# Patient Record
Sex: Male | Born: 1985 | Race: Black or African American | Hispanic: No | Marital: Single | Smoking: Current every day smoker
Health system: Southern US, Community
[De-identification: ages and names within clinical notes are randomized; demographics above are authoritative.]

## PROBLEM LIST (undated history)

## (undated) HISTORY — PX: APPENDECTOMY: SHX54

---

## 2018-03-30 ENCOUNTER — Emergency Department
Admission: EM | Admit: 2018-03-30 | Discharge: 2018-03-30 | Disposition: A | Discharge status: Home / Self Care | Payer: BLUE CROSS/BLUE SHIELD | Attending: Emergency Medicine | Admitting: Emergency Medicine

## 2018-03-30 ENCOUNTER — Encounter: Payer: Self-pay | Admitting: Emergency Medicine

## 2018-03-30 ENCOUNTER — Other Ambulatory Visit: Payer: Self-pay

## 2018-03-30 DIAGNOSIS — F17210 Nicotine dependence, cigarettes, uncomplicated: Secondary | ICD-10-CM | POA: Diagnosis not present

## 2018-03-30 DIAGNOSIS — K625 Hemorrhage of anus and rectum: Secondary | ICD-10-CM | POA: Diagnosis not present

## 2018-03-30 LAB — COMPREHENSIVE METABOLIC PANEL
ALT: 27 U/L (ref 0–44)
AST: 22 U/L (ref 15–41)
Albumin: 4.6 g/dL (ref 3.5–5.0)
Alkaline Phosphatase: 63 U/L (ref 38–126)
Anion gap: 7 (ref 5–15)
BUN: 16 mg/dL (ref 6–20)
CO2: 31 mmol/L (ref 22–32)
Calcium: 9.2 mg/dL (ref 8.9–10.3)
Chloride: 101 mmol/L (ref 98–111)
Creatinine, Ser: 0.8 mg/dL (ref 0.61–1.24)
GFR calc Af Amer: >60 mL/min (ref >60)
GFR calc non Af Amer: >60 mL/min (ref >60)
Glucose, Bld: 122 mg/dL — ABNORMAL HIGH (ref 70–99)
POTASSIUM: 3.9 mmol/L (ref 3.5–5.1)
SODIUM: 139 mmol/L (ref 135–145)
TOTAL PROTEIN: 7.4 g/dL (ref 6.5–8.1)
Total Bilirubin: 0.5 mg/dL (ref 0.3–1.2)

## 2018-03-30 LAB — CBC
HCT: 44.8 % (ref 39.0–52.0)
Hemoglobin: 15.1 g/dL (ref 13.0–17.0)
MCH: 30.6 pg (ref 26.0–34.0)
MCHC: 33.7 g/dL (ref 30.0–36.0)
MCV: 90.7 fL (ref 80.0–100.0)
Platelets: 236 10*3/uL (ref 150–400)
RBC: 4.94 MIL/uL (ref 4.22–5.81)
RDW: 11.9 % (ref 11.5–15.5)
WBC: 6.1 10*3/uL (ref 4.0–10.5)
nRBC: 0 % (ref 0.0–0.2)

## 2018-03-30 LAB — TYPE AND SCREEN
ABO/RH(D): A POS
Antibody Screen: NEGATIVE

## 2018-03-30 MED ORDER — DICYCLOMINE HCL 20 MG PO TABS: 20.0000 mg | ORAL_TABLET | Freq: Three times a day (TID) | ORAL | 0 refills | Status: AC | PRN

## 2018-03-30 MED ORDER — SUCRALFATE 1 G PO TABS: 1.0000 g | ORAL_TABLET | Freq: Four times a day (QID) | ORAL | 0 refills | Status: AC

## 2018-03-30 NOTE — ED Provider Notes (Signed)
[facility] Emergency Department Provider Note  ____________________________________________   I have reviewed the triage vital signs and the nursing notes.   HISTORY  Chief Complaint Rectal Bleeding   History limited by: Not Limited   HPI Albert Taylor is a 33 y.o. male who presents to the emergency department today because of concern for rectal bleeding.  The patient states that he had one episode of some GI bleeding roughly 1 month ago.  Today then he had an episode of dark blood in his stool and then a second episode of brighter blood.  Patient denies any associated pain.  He denies any straining with bowel movements.  He states he does have a sister with ulcerative colitis.  He denies any fevers.  Denies any recent ingestions or recent travel.   Per medical record review patient has a history of appendectomy  History reviewed. No pertinent past medical history.  There are no active problems to display for this patient.   Past Surgical History:  Procedure Laterality Date  . APPENDECTOMY      Prior to Admission medications   Not on File    Allergies Patient has no known allergies.  No family history on file.  Social History Social History   Tobacco Use  . Smoking status: Current Every Day Smoker    Packs/day: 0.50  . Smokeless tobacco: Never Used  Substance Use Topics  . Alcohol use: Never    Frequency: Never  . Drug use: Never    Review of Systems Constitutional: No fever/chills Eyes: No visual changes. ENT: No sore throat. Cardiovascular: Denies chest pain. Respiratory: Denies shortness of breath. Gastrointestinal: Positive for gi bleed.    Genitourinary: Negative for dysuria. Musculoskeletal: Negative for back pain. Skin: Negative for rash. Neurological: Negative for headaches, focal weakness or numbness.  ____________________________________________   PHYSICAL EXAM:  VITAL SIGNS: ED Triage Vitals  Enc Vitals Group     BP 03/30/18 1927 130/75     Pulse Rate 03/30/18 1927 66     Resp 03/30/18 1927 18     Temp 03/30/18 1927 98 F (36.7 C)     Temp Source 03/30/18 1927 Oral     SpO2 03/30/18 1927 98 %     Weight 03/30/18 1928 245 lb (111.1 kg)     Height 03/30/18 1928 6\' 5"  (1.956 m)     Head Circumference --      Peak Flow --      Pain Score 03/30/18 1928 0   Constitutional: Alert and oriented.  Eyes: Conjunctivae are normal.  ENT      Head: Normocephalic and atraumatic.      Nose: No congestion/rhinnorhea.      Mouth/Throat: Mucous membranes are moist.      Neck: No stridor. Cardiovascular: Normal rate, regular rhythm.  No murmurs, rubs, or gallops.  Respiratory: Normal respiratory effort without tachypnea nor retractions. Breath sounds are clear and equal bilaterally. No wheezes/rales/rhonchi. Gastrointestinal: Soft and non tender. No rebound. No guarding.  Genitourinary: Deferred Musculoskeletal: Normal range of motion in all extremities.  Neurologic:  Normal speech and language. No gross focal neurologic deficits are appreciated.  Skin:  Skin is warm, dry and intact. No rash noted. Psychiatric: Mood and affect are normal. Speech and behavior are normal. Patient exhibits appropriate insight and judgment.  ____________________________________________    LABS (pertinent positives/negatives)  CBC wbc 6.1, hgb 15.1, plt 236 CMP wnl except glu 122  ____________________________________________   EKG  None  ____________________________________________  RADIOLOGY  None  ____________________________________________   PROCEDURES  Procedures  ____________________________________________   INITIAL IMPRESSION / ASSESSMENT AND PLAN / ED COURSE  Pertinent labs & imaging results that were available during my care of the patient were reviewed by me and considered in my medical decision making (see chart for details).   Patient presented to the emergency department today because  of concerns for some GI bleeding.  Blood work here without any leukocytosis or anemia.  Patient's abdominal exam is benign.  At this point do wonder if IBS for ulcerative colitis cause.  Discussed this with the patient.  At this point given normal blood work do not think he needs any further emergent work-up.  Will give patient both GI and primary care doctor follow-up information.  ____________________________________________   FINAL CLINICAL IMPRESSION(S) / ED DIAGNOSES  Final diagnoses:  Rectal bleeding     Note: This dictation was prepared with Dragon dictation. Any transcriptional errors that result from this process are unintentional     Phineas Semen, MD 03/31/18 [phone]

## 2018-03-30 NOTE — ED Triage Notes (Signed)
Pt arrives ambulatory to triage with c/o rectal bleeding. Pt reports that earlier he had some dark blood in his stool and about 30 minutes ago he had some bright blood in his stool. Pt is in NAD.

## 2018-03-30 NOTE — Discharge Instructions (Addendum)
Please seek medical attention for any high fevers, chest pain, shortness of breath, change in behavior, persistent vomiting, bloody stool or any other new or concerning symptoms.  

## 2018-05-21 ENCOUNTER — Other Ambulatory Visit: Payer: Self-pay

## 2018-05-21 ENCOUNTER — Encounter: Payer: Self-pay | Admitting: Emergency Medicine

## 2018-05-21 ENCOUNTER — Ambulatory Visit
Admission: EM | Admit: 2018-05-21 | Discharge: 2018-05-21 | Disposition: A | Discharge status: Home / Self Care | Payer: BLUE CROSS/BLUE SHIELD | Attending: Family Medicine | Admitting: Family Medicine

## 2018-05-21 DIAGNOSIS — R0789 Other chest pain: Secondary | ICD-10-CM

## 2018-05-21 DIAGNOSIS — M940 Chondrocostal junction syndrome [Tietze]: Secondary | ICD-10-CM | POA: Insufficient documentation

## 2018-05-21 NOTE — Discharge Instructions (Signed)
Heat/ice alternating am and pm Over the counter Aleve

## 2018-05-21 NOTE — ED Triage Notes (Signed)
Patient c/o chest pain for a week.  Patient denies SOB.  Patient denies cold symptoms.  Patient denies fevers.  Patient also reports HAs off and on for couple of weeks.

## 2018-06-14 NOTE — ED Provider Notes (Signed)
[facility]    CSN: [id number] Arrival date & time: 05/21/18  1545     History   Chief Complaint Chief Complaint  Patient presents with  . Chest Pain    HPI Albert Taylor is a 33 y.o. male.   33 yo male with a c/o chest pains for the past week, worse with movement. Denies any shortness of breath, palpitations, diaphoresis, neck/jaw pain, left arm pain, fevers, chills, cough.   The history is provided by the patient.  Chest Pain    History reviewed. No pertinent past medical history.  There are no active problems to display for this patient.   Past Surgical History:  Procedure Laterality Date  . APPENDECTOMY         Home Medications    Prior to Admission medications   Medication Sig Start Date End Date Taking? Authorizing Provider  dicyclomine (BENTYL) 20 MG tablet Take 1 tablet (20 mg total) by mouth 3 (three) times daily as needed (abdominal pain). 03/30/18   Phineas SemenGoodman, Graydon, MD  sucralfate (CARAFATE) 1 g tablet Take 1 tablet (1 g total) by mouth 4 (four) times daily. 03/30/18   Phineas SemenGoodman, Graydon, MD    Family History Family History  Problem Relation Age of Onset  . Healthy Mother   . Heart attack Father     Social History Social History   Tobacco Use  . Smoking status: Current Every Day Smoker    Packs/day: 0.50    Types: Cigarettes  . Smokeless tobacco: Never Used  Substance Use Topics  . Alcohol use: Never    Frequency: Never  . Drug use: Never     Allergies   Patient has no known allergies.   Review of Systems Review of Systems  Cardiovascular: Positive for chest pain.     Physical Exam Triage Vital Signs ED Triage Vitals  Enc Vitals Group     BP 05/21/18 1557 132/75     Pulse Rate 05/21/18 1557 76     Resp 05/21/18 1557 16     Temp 05/21/18 1557 98.4 F (36.9 C)     Temp Source 05/21/18 1557 Oral     SpO2 05/21/18 1557 99 %     Weight 05/21/18 1554 250 lb (113.4 kg)     Height 05/21/18 1554 6\' 5"  (1.956 m)   Head Circumference --      Peak Flow --      Pain Score 05/21/18 1554 4     Pain Loc --      Pain Edu? --      Excl. in GC? --    No data found.  Updated Vital Signs BP 132/75 (BP Location: Right Arm)   Pulse 76   Temp 98.4 F (36.9 C) (Oral)   Resp 16   Ht 6\' 5"  (1.956 m)   Wt 113.4 kg   SpO2 99%   BMI 29.65 kg/m   Visual Acuity Right Eye Distance:   Left Eye Distance:   Bilateral Distance:    Right Eye Near:   Left Eye Near:    Bilateral Near:     Physical Exam Vitals signs and nursing note reviewed.  Constitutional:      General: He is not in acute distress.    Appearance: He is not toxic-appearing or diaphoretic.  Cardiovascular:     Rate and Rhythm: Normal rate and regular rhythm.  Pulmonary:     Effort: Pulmonary effort is normal. No respiratory distress.     Breath sounds:  Normal breath sounds. No stridor. No wheezing, rhonchi or rales.  Chest:     Chest wall: Tenderness present.  Neurological:     Mental Status: He is alert.      UC Treatments / Results  Labs (all labs ordered are listed, but only abnormal results are displayed) Labs Reviewed - No data to display  EKG None  Radiology No results found.  Procedures ED EKG Date/Time: 06/14/2018 5:13 PM Performed by: Payton Mccallum, MD Authorized by: Payton Mccallum, MD   ECG reviewed by ED Physician in the absence of a cardiologist: yes   Previous ECG:    Previous ECG:  Unavailable Interpretation:    Interpretation: normal   Rate:    ECG rate:  74   ECG rate assessment: normal   Rhythm:    Rhythm: sinus rhythm   Ectopy:    Ectopy: none   QRS:    QRS axis:  Normal Conduction:    Conduction: normal   ST segments:    ST segments:  Normal T waves:    T waves: normal     (including critical care time)  Medications Ordered in UC Medications - No data to display  Initial Impression / Assessment and Plan / UC Course  I have reviewed the triage vital signs and the nursing notes.   Pertinent labs & imaging results that were available during my care of the patient were reviewed by me and considered in my medical decision making (see chart for details).      Final Clinical Impressions(s) / UC Diagnoses   Final diagnoses:  Costochondritis     Discharge Instructions     Heat/ice alternating am and pm Over the counter Aleve    ED Prescriptions    None      1. ekg results and diagnosis reviewed with patient 2. Recommend supportive treatment as above  3. Follow-up prn if symptoms worsen or don't improve  Controlled Substance Prescriptions [place] Controlled Substance Registry consulted? Not Applicable   Payton Mccallum, MD 06/14/18 [phone]

## 2018-07-14 ENCOUNTER — Encounter: Payer: Self-pay | Admitting: Emergency Medicine

## 2018-07-14 ENCOUNTER — Emergency Department: Payer: BC Managed Care – PPO

## 2018-07-14 ENCOUNTER — Emergency Department
Admission: EM | Admit: 2018-07-14 | Discharge: 2018-07-14 | Disposition: A | Discharge status: Home / Self Care | Payer: BC Managed Care – PPO | Attending: Emergency Medicine | Admitting: Emergency Medicine

## 2018-07-14 ENCOUNTER — Other Ambulatory Visit: Payer: Self-pay

## 2018-07-14 DIAGNOSIS — S9031XA Contusion of right foot, initial encounter: Secondary | ICD-10-CM | POA: Insufficient documentation

## 2018-07-14 DIAGNOSIS — Y9289 Other specified places as the place of occurrence of the external cause: Secondary | ICD-10-CM | POA: Diagnosis not present

## 2018-07-14 DIAGNOSIS — Y9389 Activity, other specified: Secondary | ICD-10-CM | POA: Diagnosis not present

## 2018-07-14 DIAGNOSIS — T1490XA Injury, unspecified, initial encounter: Secondary | ICD-10-CM

## 2018-07-14 DIAGNOSIS — F17210 Nicotine dependence, cigarettes, uncomplicated: Secondary | ICD-10-CM | POA: Diagnosis not present

## 2018-07-14 DIAGNOSIS — Y998 Other external cause status: Secondary | ICD-10-CM | POA: Diagnosis not present

## 2018-07-14 DIAGNOSIS — W2209XA Striking against other stationary object, initial encounter: Secondary | ICD-10-CM | POA: Insufficient documentation

## 2018-07-14 DIAGNOSIS — S99921A Unspecified injury of right foot, initial encounter: Secondary | ICD-10-CM | POA: Diagnosis present

## 2018-07-14 NOTE — ED Provider Notes (Signed)
[facility] Emergency Department Provider Note  ____________________________________________  Time seen: Approximately 9:36 PM  I have reviewed the triage vital signs and the nursing notes.   HISTORY  Chief Complaint Toe Injury    HPI Albert Taylor is a 33 y.o. male who presents the emergency department complaining of pain to the second digit of the right foot.  Patient presented to the emergency department complaining of pain and injury to the toe.  Patient was playing with his daughter in the backyard when he stubbed his toe.  Patient reports that the right toe is now longer than the similar toe on the left foot.  Patient is having pain to the DIP joint to the distal aspect of the toe.  No other injury or complaint.  No medications prior to arrival.        History reviewed. No pertinent past medical history.  There are no active problems to display for this patient.   Past Surgical History:  Procedure Laterality Date  . APPENDECTOMY      Prior to Admission medications   Medication Sig Start Date End Date Taking? Authorizing Provider  dicyclomine (BENTYL) 20 MG tablet Take 1 tablet (20 mg total) by mouth 3 (three) times daily as needed (abdominal pain). 03/30/18   Phineas SemenGoodman, Graydon, MD  sucralfate (CARAFATE) 1 g tablet Take 1 tablet (1 g total) by mouth 4 (four) times daily. 03/30/18   Phineas SemenGoodman, Graydon, MD    Allergies Patient has no known allergies.  Family History  Problem Relation Age of Onset  . Healthy Mother   . Heart attack Father     Social History Social History   Tobacco Use  . Smoking status: Current Every Day Smoker    Packs/day: 0.50    Types: Cigarettes  . Smokeless tobacco: Never Used  Substance Use Topics  . Alcohol use: Never    Frequency: Never  . Drug use: Never     Review of Systems  Constitutional: No fever/chills Eyes: No visual changes. No discharge ENT: No upper respiratory complaints. Cardiovascular: no  chest pain. Respiratory: no cough. No SOB. Gastrointestinal: No abdominal pain.  No nausea, no vomiting.  No diarrhea.  No constipation. Musculoskeletal: Positive for pain to the second digit of the right foot Skin: Negative for rash, abrasions, lacerations, ecchymosis. Neurological: Negative for headaches, focal weakness or numbness. 10-point ROS otherwise negative.  ____________________________________________   PHYSICAL EXAM:  VITAL SIGNS: ED Triage Vitals  Enc Vitals Group     BP 07/14/18 2103 (!) 146/95     Pulse Rate 07/14/18 2103 80     Resp 07/14/18 2103 18     Temp 07/14/18 2103 98.2 F (36.8 C)     Temp Source 07/14/18 2103 Oral     SpO2 07/14/18 2103 99 %     Weight 07/14/18 2102 250 lb (113.4 kg)     Height 07/14/18 2102 6\' 5"  (1.956 m)     Head Circumference --      Peak Flow --      Pain Score 07/14/18 2102 3     Pain Loc --      Pain Edu? --      Excl. in GC? --      Constitutional: Alert and oriented. Well appearing and in no acute distress. Eyes: Conjunctivae are normal. PERRL. EOMI. Head: Atraumatic. Neck: No stridor.    Cardiovascular: Normal rate, regular rhythm. Normal S1 and S2.  Good peripheral circulation. Respiratory: Normal respiratory effort without tachypnea  or retractions. Lungs CTAB. Good air entry to the bases with no decreased or absent breath sounds. Musculoskeletal: Full range of motion to all extremities. No gross deformities appreciated.  Examination of the right foot reveals mild edema of the distal aspect of the second digit.  No deformity.  No ecchymosis.  No open wounds.  Patient is very tender to palpation from the DIP joint to the distal aspect of the toe.  Sensation and cap refill intact. Neurologic:  Normal speech and language. No gross focal neurologic deficits are appreciated.  Skin:  Skin is warm, dry and intact. No rash noted. Psychiatric: Mood and affect are normal. Speech and behavior are normal. Patient exhibits appropriate  insight and judgement.   ____________________________________________   LABS (all labs ordered are listed, but only abnormal results are displayed)  Labs Reviewed - No data to display ____________________________________________  EKG   ____________________________________________  RADIOLOGY I personally viewed and evaluated these images as part of my medical decision making, as well as reviewing the written report by the radiologist.  Dg Toe 2nd Right  Result Date: 07/14/2018 CLINICAL DATA:  Second toe injury EXAM: RIGHT SECOND TOE COMPARISON:  None. FINDINGS: There is no evidence of fracture or dislocation. There is no evidence of arthropathy or other focal bone abnormality. Soft tissues are unremarkable. IMPRESSION: Negative. Electronically Signed   By: Deatra Robinson M.D.   On: 07/14/2018 22:09    ____________________________________________    PROCEDURES  Procedure(s) performed:    Procedures    Medications - No data to display   ____________________________________________   INITIAL IMPRESSION / ASSESSMENT AND PLAN / ED COURSE  Pertinent labs & imaging results that were available during my care of the patient were reviewed by me and considered in my medical decision making (see chart for details).  Review of the [place] CSRS was performed in accordance of the [facility] prior to dispensing any controlled drugs.           Patient's diagnosis is consistent with foot contusion.  Patient presented to the emergency department complaining of second toe pain to the right foot.  Overall exam is reassuring.  X-ray reveals no acute osseous abnormality.  Patient is given postop shoe for symptom improvement.  Prescription for meloxicam for additional symptom control.  Follow-up primary care as needed. Patient is given ED precautions to return to the ED for any worsening or new symptoms.     ____________________________________________  FINAL CLINICAL IMPRESSION(S) / ED  DIAGNOSES  Final diagnoses:  Contusion of right foot, initial encounter      NEW MEDICATIONS STARTED DURING THIS VISIT:  ED Discharge Orders    None          This chart was dictated using voice recognition software/Dragon. Despite best efforts to proofread, errors can occur which can change the meaning. Any change was purely unintentional.    Racheal Patches, PA-C 07/14/18 2237    Dionne Bucy, MD 07/14/18 [phone]

## 2018-07-14 NOTE — ED Triage Notes (Signed)
Patient ambulatory to triage with steady gait, without difficulty or distress noted, mask in place; pt reports rt 3rd toe injury today while playing outside

## 2018-08-28 ENCOUNTER — Other Ambulatory Visit: Payer: Self-pay

## 2018-08-28 DIAGNOSIS — Z20822 Contact with and (suspected) exposure to covid-19: Secondary | ICD-10-CM

## 2018-09-03 LAB — NOVEL CORONAVIRUS, NAA: SARS-CoV-2, NAA: NOT DETECTED

## 2019-05-17 ENCOUNTER — Emergency Department (HOSPITAL_COMMUNITY): Payer: BC Managed Care – PPO

## 2019-05-17 ENCOUNTER — Emergency Department (HOSPITAL_COMMUNITY)
Admission: EM | Admit: 2019-05-17 | Discharge: 2019-05-17 | Disposition: A | Discharge status: Home / Self Care | Payer: BC Managed Care – PPO | Attending: Emergency Medicine | Admitting: Emergency Medicine

## 2019-05-17 ENCOUNTER — Other Ambulatory Visit: Payer: Self-pay

## 2019-05-17 DIAGNOSIS — S93401A Sprain of unspecified ligament of right ankle, initial encounter: Secondary | ICD-10-CM | POA: Insufficient documentation

## 2019-05-17 DIAGNOSIS — T07XXXA Unspecified multiple injuries, initial encounter: Secondary | ICD-10-CM

## 2019-05-17 DIAGNOSIS — S99911A Unspecified injury of right ankle, initial encounter: Secondary | ICD-10-CM | POA: Diagnosis present

## 2019-05-17 DIAGNOSIS — S40811A Abrasion of right upper arm, initial encounter: Secondary | ICD-10-CM | POA: Insufficient documentation

## 2019-05-17 DIAGNOSIS — Y92410 Unspecified street and highway as the place of occurrence of the external cause: Secondary | ICD-10-CM | POA: Insufficient documentation

## 2019-05-17 DIAGNOSIS — T1490XA Injury, unspecified, initial encounter: Secondary | ICD-10-CM

## 2019-05-17 DIAGNOSIS — Y999 Unspecified external cause status: Secondary | ICD-10-CM | POA: Diagnosis not present

## 2019-05-17 DIAGNOSIS — S30811A Abrasion of abdominal wall, initial encounter: Secondary | ICD-10-CM | POA: Diagnosis not present

## 2019-05-17 DIAGNOSIS — Y939 Activity, unspecified: Secondary | ICD-10-CM | POA: Insufficient documentation

## 2019-05-17 LAB — I-STAT CHEM 8, ED
BUN: 15 mg/dL (ref 6–20)
Calcium, Ion: 1.09 mmol/L — ABNORMAL LOW (ref 1.15–1.40)
Chloride: 103 mmol/L (ref 98–111)
Creatinine, Ser: 0.9 mg/dL (ref 0.61–1.24)
Glucose, Bld: 100 mg/dL — ABNORMAL HIGH (ref 70–99)
HCT: 44 % (ref 39.0–52.0)
Hemoglobin: 15 g/dL (ref 13.0–17.0)
Potassium: 4.1 mmol/L (ref 3.5–5.1)
Sodium: 137 mmol/L (ref 135–145)
TCO2: 27 mmol/L (ref 22–32)

## 2019-05-17 LAB — COMPREHENSIVE METABOLIC PANEL WITH GFR
ALT: 63 U/L — ABNORMAL HIGH (ref 0–44)
AST: 34 U/L (ref 15–41)
Albumin: 4 g/dL (ref 3.5–5.0)
Alkaline Phosphatase: 78 U/L (ref 38–126)
Anion gap: 12 (ref 5–15)
BUN: 14 mg/dL (ref 6–20)
CO2: 21 mmol/L — ABNORMAL LOW (ref 22–32)
Calcium: 9 mg/dL (ref 8.9–10.3)
Chloride: 103 mmol/L (ref 98–111)
Creatinine, Ser: 0.93 mg/dL (ref 0.61–1.24)
GFR calc Af Amer: >60 mL/min
GFR calc non Af Amer: >60 mL/min
Glucose, Bld: 106 mg/dL — ABNORMAL HIGH (ref 70–99)
Potassium: 4.1 mmol/L (ref 3.5–5.1)
Sodium: 136 mmol/L (ref 135–145)
Total Bilirubin: 0.5 mg/dL (ref 0.3–1.2)
Total Protein: 6.9 g/dL (ref 6.5–8.1)

## 2019-05-17 LAB — CBC
HCT: 45.9 % (ref 39.0–52.0)
Hemoglobin: 15.4 g/dL (ref 13.0–17.0)
MCH: 30.3 pg (ref 26.0–34.0)
MCHC: 33.6 g/dL (ref 30.0–36.0)
MCV: 90.4 fL (ref 80.0–100.0)
Platelets: 258 10*3/uL (ref 150–400)
RBC: 5.08 MIL/uL (ref 4.22–5.81)
RDW: 11.9 % (ref 11.5–15.5)
WBC: 6.8 10*3/uL (ref 4.0–10.5)
nRBC: 0 % (ref 0.0–0.2)

## 2019-05-17 LAB — LACTIC ACID, PLASMA: Lactic Acid, Venous: 1.6 mmol/L (ref 0.5–1.9)

## 2019-05-17 LAB — PROTIME-INR
INR: 0.9 (ref 0.8–1.2)
Prothrombin Time: 12.2 s (ref 11.4–15.2)

## 2019-05-17 LAB — ETHANOL: Alcohol, Ethyl (B): <10 mg/dL

## 2019-05-17 MED ORDER — BACITRACIN ZINC 500 UNIT/GM EX OINT: TOPICAL_OINTMENT | CUTANEOUS | Status: DC | PRN

## 2019-05-17 MED ORDER — OXYCODONE-ACETAMINOPHEN 5-325 MG PO TABS: 1.0000 | ORAL_TABLET | ORAL | 0 refills | Status: DC | PRN

## 2019-05-17 MED ORDER — MORPHINE SULFATE (PF) 4 MG/ML IV SOLN
4.0000 mg | Freq: Once | INTRAVENOUS | Status: AC
  Administered 2019-05-17: 4 mg via INTRAVENOUS
  Filled 2019-05-17: qty 1

## 2019-05-17 MED ORDER — OXYCODONE-ACETAMINOPHEN 5-325 MG PO TABS
2.0000 | ORAL_TABLET | Freq: Once | ORAL | Status: AC
  Administered 2019-05-17: 2 via ORAL
  Filled 2019-05-17: qty 2

## 2019-05-17 MED ORDER — BACITRACIN ZINC 500 UNIT/GM EX OINT
TOPICAL_OINTMENT | CUTANEOUS | Status: DC | PRN
  Filled 2019-05-17: qty 28.4

## 2019-05-17 NOTE — Discharge Instructions (Addendum)
Please use crutches as needed for right ankle pain Please keep elevated and use cold therapy. Call Dr. Diamantina [place] office for follow-up next week.
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
## 2019-05-17 NOTE — ED Notes (Signed)
X-ray at bedside

## 2019-05-17 NOTE — ED Notes (Signed)
No CTs needed at this time per Rosalia Hammers, MD.
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
## 2019-05-18 LAB — SAMPLE TO BLOOD BANK

## 2019-05-19 ENCOUNTER — Encounter (HOSPITAL_COMMUNITY): Payer: Self-pay

## 2019-05-19 ENCOUNTER — Other Ambulatory Visit: Payer: Self-pay

## 2019-05-19 ENCOUNTER — Emergency Department (HOSPITAL_COMMUNITY)
Admission: EM | Admit: 2019-05-19 | Discharge: 2019-05-20 | Disposition: A | Discharge status: Home / Self Care | Payer: BC Managed Care – PPO | Attending: Emergency Medicine | Admitting: Emergency Medicine

## 2019-05-19 DIAGNOSIS — M549 Dorsalgia, unspecified: Secondary | ICD-10-CM | POA: Diagnosis not present

## 2019-05-19 DIAGNOSIS — M25511 Pain in right shoulder: Secondary | ICD-10-CM | POA: Insufficient documentation
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
## 2019-06-03 ENCOUNTER — Ambulatory Visit: Payer: BC Managed Care – PPO | Admitting: Orthopedic Surgery

## 2020-11-15 ENCOUNTER — Emergency Department (HOSPITAL_COMMUNITY)
Admission: EM | Admit: 2020-11-15 | Discharge: 2020-11-16 | Disposition: A | Discharge status: Home / Self Care | Payer: BC Managed Care – PPO | Attending: Emergency Medicine | Admitting: Emergency Medicine

## 2020-11-15 ENCOUNTER — Encounter (HOSPITAL_COMMUNITY): Payer: Self-pay | Admitting: Emergency Medicine

## 2020-11-15 DIAGNOSIS — R42 Dizziness and giddiness: Secondary | ICD-10-CM | POA: Diagnosis not present

## 2020-11-15 DIAGNOSIS — F17210 Nicotine dependence, cigarettes, uncomplicated: Secondary | ICD-10-CM | POA: Diagnosis not present

## 2020-11-15 DIAGNOSIS — T782XXA Anaphylactic shock, unspecified, initial encounter: Secondary | ICD-10-CM | POA: Diagnosis not present

## 2020-11-15 DIAGNOSIS — L509 Urticaria, unspecified: Secondary | ICD-10-CM | POA: Diagnosis not present

## 2020-11-15 MED ORDER — SODIUM CHLORIDE 0.9 % IV BOLUS
1000.0000 mL | Freq: Once | INTRAVENOUS | Status: AC
  Administered 2020-11-15: 1000 mL via INTRAVENOUS

## 2020-11-15 MED ORDER — EPINEPHRINE 0.3 MG/0.3ML IJ SOAJ: 0.3000 mg | INTRAMUSCULAR | 0 refills | Status: AC | PRN

## 2020-11-15 MED ORDER — FAMOTIDINE IN NACL 20-0.9 MG/50ML-% IV SOLN
20.0000 mg | Freq: Once | INTRAVENOUS | Status: AC
  Administered 2020-11-15: 20 mg via INTRAVENOUS
  Filled 2020-11-15: qty 50

## 2020-11-15 MED ORDER — PREDNISONE 50 MG PO TABS: 50.0000 mg | ORAL_TABLET | Freq: Every day | ORAL | 0 refills | Status: AC

## 2020-11-15 NOTE — ED Triage Notes (Signed)
Pt in via [facility] w/anaphylaxis, unknown allergy - works night shift, and woke up this afternoon with generalized hives. Took Benadryl at 1pm, 6pm. EMS gave 125mg  solumedrol and IM Epi en route. Benadryl at 1pm. NS en route. Felt syncopal about an hr ago, and called EMS. BP initially 89/60, 150/90 after fluids. Sats 99% en route RA. Significant facial swelling and generalized hives present

## 2020-11-15 NOTE — Discharge Instructions (Signed)
Your history, exam, work-up today are consistent with an anaphylactic reaction to some substance.  Your symptoms were able to improve after the steroids, epinephrine, and antihistamines provided both by EMS and the emergency department.  Your symptoms remained improved after over 4 hours of observation.  Please fill the prescriptions for EpiPen and steroids and use over-the-counter antihistamines to help with itching.  Please take steroids for the next 5 days starting tomorrow and please follow-up with both a PCP and an allergist for allergy testing.  If any symptoms change or worsen acutely, please return to the nearest emergency department.  Please rest and stay hydrated.

## 2020-11-15 NOTE — ED Provider Notes (Signed)
[facility] EMERGENCY DEPARTMENT Provider Note   CSN: [id number] Arrival date & time: 11/15/20  1935     History No chief complaint on file.   Natasha Stangelo is a 35 y.o. male.  The history is provided by the patient, medical records and the EMS personnel. No language interpreter was used.  Allergic Reaction Presenting symptoms: itching, rash and swelling   Presenting symptoms: no wheezing   Severity:  Severe Duration:  5 hours Prior allergic episodes:  No prior episodes Relieved by:  Epinephrine, antihistamines and steroids Worsened by:  Nothing Ineffective treatments:  None tried     No past medical history on file.  There are no problems to display for this patient.   Past Surgical History:  Procedure Laterality Date   APPENDECTOMY         Family History  Problem Relation Age of Onset   Healthy Mother    Heart attack Father     Social History   Tobacco Use   Smoking status: Every Day    Packs/day: 0.50    Types: Cigarettes   Smokeless tobacco: Never  Vaping Use   Vaping Use: Never used  Substance Use Topics   Alcohol use: Never   Drug use: Never    Home Medications Prior to Admission medications   Medication Sig Start Date End Date Taking? Authorizing Provider  dicyclomine (BENTYL) 20 MG tablet Take 1 tablet (20 mg total) by mouth 3 (three) times daily as needed (abdominal pain). 03/30/18   Phineas Semen, MD  oxyCODONE-acetaminophen (PERCOCET/ROXICET) 5-325 MG tablet Take 1 tablet by mouth every 4 (four) hours as needed for severe pain. 05/17/19   Margarita Grizzle, MD  sucralfate (CARAFATE) 1 g tablet Take 1 tablet (1 g total) by mouth 4 (four) times daily. 03/30/18   Phineas Semen, MD    Allergies    Patient has no known allergies.  Review of Systems   Review of Systems  Constitutional:  Positive for fatigue.  HENT:  Positive for facial swelling. Negative for congestion.   Eyes:  Negative for visual disturbance.   Respiratory:  Negative for choking, chest tightness, shortness of breath, wheezing and stridor.   Cardiovascular:  Negative for chest pain and leg swelling.  Gastrointestinal:  Negative for abdominal pain.  Genitourinary:  Negative for flank pain.  Musculoskeletal:  Negative for back pain.  Skin:  Positive for itching and rash. Negative for wound.  Neurological:  Negative for seizures, light-headedness and headaches.  Psychiatric/Behavioral:  Negative for agitation.   All other systems reviewed and are negative.  Physical Exam Updated Vital Signs There were no vitals taken for this visit.  Physical Exam Vitals and nursing note reviewed.  Constitutional:      General: He is not in acute distress.    Appearance: He is well-developed. He is ill-appearing. He is not toxic-appearing or diaphoretic.  HENT:     Head: Atraumatic.     Nose: No congestion or rhinorrhea.     Mouth/Throat:     Mouth: Mucous membranes are moist.     Pharynx: No oropharyngeal exudate or posterior oropharyngeal erythema.  Eyes:     Extraocular Movements: Extraocular movements intact.     Conjunctiva/sclera: Conjunctivae normal.     Pupils: Pupils are equal, round, and reactive to light.  Cardiovascular:     Rate and Rhythm: Normal rate and regular rhythm.     Heart sounds: No murmur heard. Pulmonary:     Effort: Pulmonary effort is normal.  No respiratory distress.     Breath sounds: Normal breath sounds.  Abdominal:     General: Abdomen is flat.     Palpations: Abdomen is soft.     Tenderness: There is no abdominal tenderness.  Musculoskeletal:        General: No tenderness.     Cervical back: Neck supple. No tenderness.  Skin:    General: Skin is warm and dry.     Capillary Refill: Capillary refill takes less than 2 seconds.     Findings: Erythema and rash present. Rash is urticarial.     Comments:   Diffuse urticarial rash and facial swelling.  No wheezing.  No stridor.  No sensation of throat  closing or evidence of swelling in his lips or tongue.  Neurological:     General: No focal deficit present.     Mental Status: He is alert.  Psychiatric:        Mood and Affect: Mood normal.    ED Results / Procedures / Treatments   Labs (all labs ordered are listed, but only abnormal results are displayed) Labs Reviewed - No data to display  EKG None  Radiology No results found.  Procedures Procedures   CRITICAL CARE Performed by: Canary Brim Alizzon Dioguardi Total critical care time: 35 minutes Critical care time was exclusive of separately billable procedures and treating other patients. Critical care was necessary to treat or prevent imminent or life-threatening deterioration. Critical care was time spent personally by me on the following activities: development of treatment plan with patient and/or surrogate as well as nursing, discussions with consultants, evaluation of patient's response to treatment, examination of patient, obtaining history from patient or surrogate, ordering and performing treatments and interventions, ordering and review of laboratory studies, ordering and review of radiographic studies, pulse oximetry and re-evaluation of patient's condition.  Medications Ordered in ED Medications  sodium chloride 0.9 % bolus 1,000 mL (0 mLs Intravenous Stopped 11/15/20 2106)  famotidine (PEPCID) IVPB 20 mg premix (0 mg Intravenous Stopped 11/15/20 2023)    ED Course  I have reviewed the triage vital signs and the nursing notes.  Pertinent labs & imaging results that were available during my care of the patient were reviewed by me and considered in my medical decision making (see chart for details).    MDM Rules/Calculators/A&P                           Matheus Kangas is a 35 y.o. male with no significant past medical history who presents with anaphylactic reaction.  According to EMS and patient, patient woke up around 1 PM this afternoon with some itching.  He then went  back to sleep but then woke up about an hour ago with diffuse urticarial rash, facial swelling, and continued itching.  He then got lightheaded like he was going to pass out but did not actually syncopized.  He denies any difficulty breathing or swallowing and denies any sensation of swelling in his throat or in his mouth.  According to EMS, patient started to worsen and was getting lightheaded with the facial swelling they decided to provide antihistamines, steroids, and epi in the field.  Patient received IM epinephrine, Solu-Medrol, and Benadryl.  He reports he has never had an allergic reaction like this in the past and denies any known exposures.  He denies any new foods or medications, denies any other exposures to supplements or drinks and denies any other cause  of this.  On arrival, patient thinks that things are starting to plateau and stabilize and do not seem to be worsening.  On my exam, patient does have diffuse urticaria all over his body including the face.  He does have swelling and his eyes are nearly swollen shut with facial swelling.  He does not have evidence of lip swelling or tongue swelling and denies any difficulty breathing.  He did not have stridor and did not have wheezing on my exam.  Initial vital signs while he was receiving fluids was not hypotensive or tachycardic.  Afebrile.  Patient now resting but jittery from the appendectomy.  Clinically I suspect that he has an unknown anaphylactic reaction and will need to be observed for several hours to make sure things do not worsen.  As he received the Solu-Medrol, epi, and Benadryl, we will only give some fluids and IV Pepcid to add to his antihistamine regimen.  We will continue to watch him.  If symptoms worsen, anticipate reducing of epi and likely admission however if the symptoms continue to stabilize and improve after several hours, anticipate discharge home.  EMS did report that his blood pressure was in the 80s when he  first saw him and he was lightheaded.  Anticipate reassessment after medications and monitoring.  8:56 PM Patient just reassessed and his rash starting to improve.  He is not hypotensive and is resting comfortably.  Facial swelling seems to be stabilizing as well.  We will continue to monitor.  Patient's epi was given between 6 or 7 PM.  We will continue to monitor.    Final Clinical Impression(s) / ED Diagnoses Final diagnoses:  Anaphylaxis, initial encounter     Clinical Impression: 1. Anaphylaxis, initial encounter     Disposition: Discharge  Condition: Good  I have discussed the results, Dx and Tx plan with the pt(& family if present). He/she/they expressed understanding and agree(s) with the plan. Discharge instructions discussed at great length. Strict return precautions discussed and pt &/or family have verbalized understanding of the instructions. No further questions at time of discharge.    New Prescriptions   EPINEPHRINE 0.3 MG/0.3 ML IJ SOAJ INJECTION    Inject 0.3 mg into the muscle as needed for anaphylaxis.   PREDNISONE (DELTASONE) 50 MG TABLET    Take 1 tablet (50 mg total) by mouth daily with breakfast for 5 days.    Follow Up: [facility] [address] [place] [address] [phone] Schedule an appointment as soon as possible for a visit    [facility] EMERGENCY DEPARTMENT [address] [phone] mc [place] [address] [phone]    an allergist        Mande Auvil, Canary Brim, MD 11/15/20 2351

## 2020-11-19 ENCOUNTER — Emergency Department (HOSPITAL_BASED_OUTPATIENT_CLINIC_OR_DEPARTMENT_OTHER)
Admission: EM | Admit: 2020-11-19 | Discharge: 2020-11-19 | Disposition: A | Discharge status: Home / Self Care | Payer: BC Managed Care – PPO | Attending: Emergency Medicine | Admitting: Emergency Medicine

## 2020-11-19 ENCOUNTER — Other Ambulatory Visit: Payer: Self-pay

## 2020-11-19 ENCOUNTER — Emergency Department (HOSPITAL_BASED_OUTPATIENT_CLINIC_OR_DEPARTMENT_OTHER): Payer: BC Managed Care – PPO | Admitting: Radiology

## 2020-11-19 ENCOUNTER — Encounter (HOSPITAL_BASED_OUTPATIENT_CLINIC_OR_DEPARTMENT_OTHER): Payer: Self-pay

## 2020-11-19 DIAGNOSIS — Z20822 Contact with and (suspected) exposure to covid-19: Secondary | ICD-10-CM | POA: Insufficient documentation

## 2020-11-19 DIAGNOSIS — F17210 Nicotine dependence, cigarettes, uncomplicated: Secondary | ICD-10-CM | POA: Insufficient documentation

## 2020-11-19 DIAGNOSIS — K2100 Gastro-esophageal reflux disease with esophagitis, without bleeding: Secondary | ICD-10-CM | POA: Insufficient documentation

## 2020-11-19 DIAGNOSIS — J029 Acute pharyngitis, unspecified: Secondary | ICD-10-CM | POA: Diagnosis not present

## 2020-11-19 LAB — CBC WITH DIFFERENTIAL/PLATELET
Abs Immature Granulocytes: 0.04 10*3/uL (ref 0.00–0.07)
Basophils Absolute: 0 10*3/uL (ref 0.0–0.1)
Basophils Relative: 0 %
Eosinophils Absolute: 0.1 10*3/uL (ref 0.0–0.5)
Eosinophils Relative: 1 %
HCT: 39 % (ref 39.0–52.0)
Hemoglobin: 13.2 g/dL (ref 13.0–17.0)
Immature Granulocytes: 1 %
Lymphocytes Relative: 13 %
Lymphs Abs: 1.1 10*3/uL (ref 0.7–4.0)
MCH: 30.8 pg (ref 26.0–34.0)
MCHC: 33.8 g/dL (ref 30.0–36.0)
MCV: 90.9 fL (ref 80.0–100.0)
Monocytes Absolute: 0.6 10*3/uL (ref 0.1–1.0)
Monocytes Relative: 7 %
Neutro Abs: 6.9 10*3/uL (ref 1.7–7.7)
Neutrophils Relative %: 78 %
Platelets: 229 10*3/uL (ref 150–400)
RBC: 4.29 MIL/uL (ref 4.22–5.81)
RDW: 12.9 % (ref 11.5–15.5)
WBC: 8.8 10*3/uL (ref 4.0–10.5)
nRBC: 0 % (ref 0.0–0.2)

## 2020-11-19 LAB — BASIC METABOLIC PANEL
Anion gap: 6 (ref 5–15)
BUN: 19 mg/dL (ref 6–20)
CO2: 32 mmol/L (ref 22–32)
Calcium: 9.1 mg/dL (ref 8.9–10.3)
Chloride: 102 mmol/L (ref 98–111)
Creatinine, Ser: 0.79 mg/dL (ref 0.61–1.24)
GFR, Estimated: >60 mL/min (ref >60)
Glucose, Bld: 101 mg/dL — ABNORMAL HIGH (ref 70–99)
Potassium: 3.8 mmol/L (ref 3.5–5.1)
Sodium: 140 mmol/L (ref 135–145)

## 2020-11-19 LAB — RESP PANEL BY RT-PCR (FLU A&B, COVID) ARPGX2
Influenza A by PCR: NEGATIVE
Influenza B by PCR: NEGATIVE
SARS Coronavirus 2 by RT PCR: NEGATIVE

## 2020-11-19 LAB — TROPONIN I (HIGH SENSITIVITY): Troponin I (High Sensitivity): 5 ng/L (ref <18)

## 2020-11-19 MED ORDER — OMEPRAZOLE 20 MG PO CPDR: 20.0000 mg | DELAYED_RELEASE_CAPSULE | Freq: Every day | ORAL | 0 refills | Status: AC

## 2020-11-19 NOTE — ED Notes (Signed)
Pt discharged to home. Discharge instructions have been discussed with patient and/or family members. Pt verbally acknowledges understanding d/c instructions, and endorses comprehension to checkout at registration before leaving.  °

## 2020-11-19 NOTE — ED Triage Notes (Signed)
Pt arrives POV with his fiance.  Pt was seen in ED on 10/4 d/t anaphylactic reaction.  Today he presents with sore throat and difficulty swallowing.  Reports one episode of vomiting this am.  Denies any difficulty breathing.

## 2020-11-19 NOTE — ED Provider Notes (Signed)
[facility] EMERGENCY DEPT Provider Note   CSN: [id number] Arrival date & time: 11/19/20  1140     History Chief Complaint  Patient presents with   Sore Throat    Albert Taylor is a 35 y.o. male who presents to the emergency department today for sore throat and difficulty swallowing x 4 days, worsening since 5 AM this morning.  Patient was recently seen emergency department on 10/4 after an anaphylactic reaction to an unknown allergen.  His chief complaint at that time was diffuse swelling and rash, which he states has gotten better.  However he continues to have lingering pain in his throat and his chest, he describes it as "feeling like he needs to get something up". He states his pain is worse after coughing.  He is able to tolerate p.o. but it hurts to swallow.  He did have an episode of emesis this morning.  He is currently on 50 mg prednisone course, last dose tomorrow.  Takes no other medications daily.  He denies fevers, chills, congestion, shortness of breath, abdominal pain, headaches, lightheadedness.   Sore Throat Associated symptoms include chest pain. Pertinent negatives include no abdominal pain, no headaches and no shortness of breath.      History reviewed. No pertinent past medical history.  There are no problems to display for this patient.   Past Surgical History:  Procedure Laterality Date   APPENDECTOMY         Family History  Problem Relation Age of Onset   Healthy Mother    Heart attack Father     Social History   Tobacco Use   Smoking status: Every Day    Packs/day: 0.50    Types: Cigarettes   Smokeless tobacco: Never  Vaping Use   Vaping Use: Never used  Substance Use Topics   Alcohol use: Never   Drug use: Never    Home Medications Prior to Admission medications   Medication Sig Start Date End Date Taking? Authorizing Provider  EPINEPHrine 0.3 mg/0.3 mL IJ SOAJ injection Inject 0.3 mg into the muscle as needed for  anaphylaxis. 11/15/20  Yes Tegeler, Canary Brim, MD  omeprazole (PRILOSEC) 20 MG capsule Take 1 capsule (20 mg total) by mouth daily. 11/19/20 12/19/20 Yes Rontae Inglett T, PA-C  predniSONE (DELTASONE) 50 MG tablet Take 1 tablet (50 mg total) by mouth daily with breakfast for 5 days. 11/15/20 11/20/20 Yes Tegeler, Canary Brim, MD  dicyclomine (BENTYL) 20 MG tablet Take 1 tablet (20 mg total) by mouth 3 (three) times daily as needed (abdominal pain). 03/30/18   Phineas Semen, MD  oxyCODONE-acetaminophen (PERCOCET/ROXICET) 5-325 MG tablet Take 1 tablet by mouth every 4 (four) hours as needed for severe pain. Patient not taking: Reported on 11/19/2020 05/17/19   Margarita Grizzle, MD  sucralfate (CARAFATE) 1 g tablet Take 1 tablet (1 g total) by mouth 4 (four) times daily. 03/30/18   Phineas Semen, MD    Allergies    Patient has no known allergies.  Review of Systems   Review of Systems  Constitutional:  Negative for chills and fever.  HENT:  Positive for sore throat. Negative for congestion.   Respiratory:  Positive for cough. Negative for chest tightness, shortness of breath and wheezing.   Cardiovascular:  Positive for chest pain.  Gastrointestinal:  Positive for nausea and vomiting. Negative for abdominal pain.  Neurological:  Negative for dizziness, light-headedness and headaches.  All other systems reviewed and are negative.  Physical Exam Updated Vital Signs BP  121/78   Pulse 75   Temp 98.3 F (36.8 C) (Oral)   Resp 16   Ht 6\' 5"  (1.956 m)   Wt 117.9 kg   SpO2 97%   BMI 30.83 kg/m   Physical Exam Vitals and nursing note reviewed.  Constitutional:      Appearance: Normal appearance.  HENT:     Head: Normocephalic and atraumatic.     Jaw: No trismus.     Mouth/Throat:     Mouth: Mucous membranes are moist. No angioedema.     Pharynx: No pharyngeal swelling.     Tonsils: No tonsillar exudate.  Eyes:     Conjunctiva/sclera: Conjunctivae normal.  Cardiovascular:      Rate and Rhythm: Normal rate and regular rhythm.  Pulmonary:     Effort: Pulmonary effort is normal. No respiratory distress.     Breath sounds: Normal breath sounds.  Abdominal:     General: There is no distension.     Palpations: Abdomen is soft.     Tenderness: There is no abdominal tenderness.  Skin:    General: Skin is warm and dry.  Neurological:     General: No focal deficit present.     Mental Status: He is alert.    ED Results / Procedures / Treatments   Labs (all labs ordered are listed, but only abnormal results are displayed) Labs Reviewed  BASIC METABOLIC PANEL - Abnormal; Notable for the following components:      Result Value   Glucose, Bld 101 (*)    All other components within normal limits  RESP PANEL BY RT-PCR (FLU A&B, COVID) ARPGX2  CBC WITH DIFFERENTIAL/PLATELET  TROPONIN I (HIGH SENSITIVITY)  TROPONIN I (HIGH SENSITIVITY)    EKG None  Radiology DG Chest 2 View  Result Date: 11/19/2020 CLINICAL DATA:  Chest pain EXAM: CHEST - 2 VIEW COMPARISON:  05/17/2019 FINDINGS: The heart size and mediastinal contours are within normal limits. Both lungs are clear. The visualized skeletal structures are unremarkable. IMPRESSION: No active cardiopulmonary disease. Electronically Signed   By: 07/17/2019 M.D.   On: 11/19/2020 13:11    Procedures Procedures   Medications Ordered in ED Medications - No data to display  ED Course  I have reviewed the triage vital signs and the nursing notes.  Pertinent labs & imaging results that were available during my care of the patient were reviewed by me and considered in my medical decision making (see chart for details).    MDM Rules/Calculators/A&P                           Patient is 35 year old male who presents with sore throat, nonproductive cough, and chest pain x4 days.  He was seen in the ED on 10/4 after an allergic reaction to an unknown allergen.  He was given epinephrine, steroids, Benadryl and observed  for several hours.  He was discharged in stable condition, with referral to allergy.  Differential today to include ACS, infection, reflux, PE.  On exam patient is afebrile, not hypoxic, not tachycardic and in no acute distress.  Lung sounds clear to auscultation bilaterally.  Oropharynx clear with no obvious erythema or edema.  Troponin negative. CBC and BMP unremarkable. CXR negative for acute cardiopulmonary abnormality.  COVID and flu testing negative.  He is PERC negative for PE.  Symptoms likely related to reflux secondary to significant steroid use recently.  He is not requiring admission or inpatient treatment for  symptoms at this time.  Plan to discharge home with prescription for omeprazole and follow-up with his primary care provider.  Discussed strict return precautions.  Patient is agreeable to plan.  Final Clinical Impression(s) / ED Diagnoses Final diagnoses:  Sore throat  Gastroesophageal reflux disease with esophagitis without hemorrhage    Rx / DC Orders ED Discharge Orders          Ordered    omeprazole (PRILOSEC) 20 MG capsule  Daily        11/19/20 1404             Nazier Neyhart T, PA-C 11/19/20 1429    Alvira Monday, MD 11/20/20 2305

## 2020-11-19 NOTE — Discharge Instructions (Addendum)
You were seen in the emergency department today for sore throat and chest pain.  Your lab work and imaging today was all reassuring.  We also tested you for COVID and flu and these are both negative.  It is possible that your symptoms are related to reflux after all of the steroids you have been taking.    I am prescribing you a medicine called omeprazole which is an acid reducing medication for your stomach.  It works best if you take this about 30 minutes before your largest meal of the day.  You can take 1 tablet daily.  I also recommend making your primary doctor aware of this as well as the allergist when you see them.  Continue to monitor how you're doing and return to the ER for new or worsening symptoms such as inability to swallow or difficulty breathing. It has been a pleasure seeing and caring for you today and I hope you start feeling better soon.

## 2021-01-14 ENCOUNTER — Encounter (HOSPITAL_COMMUNITY): Payer: Self-pay

## 2021-01-14 ENCOUNTER — Ambulatory Visit (HOSPITAL_COMMUNITY)
Admission: EM | Admit: 2021-01-14 | Discharge: 2021-01-14 | Disposition: A | Discharge status: Home / Self Care | Payer: BC Managed Care – PPO | Attending: Emergency Medicine | Admitting: Emergency Medicine

## 2021-01-14 ENCOUNTER — Other Ambulatory Visit: Payer: Self-pay

## 2021-01-14 ENCOUNTER — Ambulatory Visit: Admission: EM | Admit: 2021-01-14 | Payer: BC Managed Care – PPO

## 2021-01-14 DIAGNOSIS — Z5189 Encounter for other specified aftercare: Secondary | ICD-10-CM | POA: Diagnosis not present

## 2021-01-14 MED ORDER — CLINDAMYCIN HCL 300 MG PO CAPS: 300.0000 mg | ORAL_CAPSULE | Freq: Four times a day (QID) | ORAL | 0 refills | Status: AC

## 2021-01-14 NOTE — Discharge Instructions (Signed)
Please make follow up appointment with dentist. Take Clindamycin 4 times daily for the next ten days.

## 2021-01-14 NOTE — ED Triage Notes (Signed)
Pt reports having all of his teeth pulled out. States he has noticed a bad smell in his mouth and taste. States he had taken Amoxicillin after the surgery.

## 2021-01-14 NOTE — ED Provider Notes (Signed)
[facility]  ____________________________________________  Time seen: Approximately 1:45 PM  I have reviewed the triage vital signs and the nursing notes.   HISTORY  Chief Complaint mouth problem   Historian Patient    HPI Albert Taylor is a 35 y.o. male presents to the urgent care with concern for halitosis after dental extraction.  Patient states that a week ago he had all of his teeth removed and dentures placed.  He states he has been adjusting to dentures well and has not noticed any gingival redness or erythema.  He states that he has been having purulent nasal drainage and continues purulent drainage.  He denies fever and chills.  No difficulty swallowing or pain underneath the tongue.  He states he recently completed a course of amoxicillin.   History reviewed. No pertinent past medical history.   Immunizations up to date:  Yes.     History reviewed. No pertinent past medical history.  There are no problems to display for this patient.   Past Surgical History:  Procedure Laterality Date   APPENDECTOMY      Prior to Admission medications   Medication Sig Start Date End Date Taking? Authorizing Provider  clindamycin (CLEOCIN) 300 MG capsule Take 1 capsule (300 mg total) by mouth 4 (four) times daily for 10 days. 01/14/21 01/24/21 Yes Pia Mau M, PA-C  dicyclomine (BENTYL) 20 MG tablet Take 1 tablet (20 mg total) by mouth 3 (three) times daily as needed (abdominal pain). 03/30/18   Phineas Semen, MD  EPINEPHrine 0.3 mg/0.3 mL IJ SOAJ injection Inject 0.3 mg into the muscle as needed for anaphylaxis. 11/15/20   Tegeler, Canary Brim, MD  omeprazole (PRILOSEC) 20 MG capsule Take 1 capsule (20 mg total) by mouth daily. 11/19/20 12/19/20  Roemhildt, Lorin T, PA-C  oxyCODONE-acetaminophen (PERCOCET/ROXICET) 5-325 MG tablet Take 1 tablet by mouth every 4 (four) hours as needed for severe pain. Patient not taking: Reported on 11/19/2020 05/17/19   Margarita Grizzle,  MD  sucralfate (CARAFATE) 1 g tablet Take 1 tablet (1 g total) by mouth 4 (four) times daily. 03/30/18   Phineas Semen, MD    Allergies Patient has no known allergies.  Family History  Problem Relation Age of Onset   Healthy Mother    Heart attack Father     Social History Social History   Tobacco Use   Smoking status: Every Day    Packs/day: 0.50    Types: Cigarettes   Smokeless tobacco: Never  Vaping Use   Vaping Use: Never used  Substance Use Topics   Alcohol use: Never   Drug use: Never     Review of Systems  Constitutional: No fever/chills Eyes:  No discharge ENT: No upper respiratory complaints. Respiratory: no cough. No SOB/ use of accessory muscles to breath Gastrointestinal:   No nausea, no vomiting.  No diarrhea.  No constipation. Musculoskeletal: Negative for musculoskeletal pain. Skin: Negative for rash, abrasions, lacerations, ecchymosis.    ____________________________________________   PHYSICAL EXAM:  VITAL SIGNS: ED Triage Vitals  Enc Vitals Group     BP 01/14/21 1331 127/74     Pulse Rate 01/14/21 1330 60     Resp 01/14/21 1330 18     Temp 01/14/21 1330 98.1 F (36.7 C)     Temp Source 01/14/21 1330 Oral     SpO2 01/14/21 1330 99 %     Weight --      Height --      Head Circumference --  Peak Flow --      Pain Score 01/14/21 1329 0     Pain Loc --      Pain Edu? --      Excl. in GC? --      Constitutional: Alert and oriented. Well appearing and in no acute distress. Eyes: Conjunctivae are normal. PERRL. EOMI. Head: Atraumatic. ENT:      Nose: No congestion/rhinnorhea.      Mouth/Throat: Mucous membranes are moist.  No gingival erythema.  Tonsils are very small and uvula is midline.  Cobblestoning is visualized. Neck: No stridor.  No cervical spine tenderness to palpation. Cardiovascular: Normal rate, regular rhythm. Normal S1 and S2.  Good peripheral circulation. Respiratory: Normal respiratory effort without tachypnea  or retractions. Lungs CTAB. Good air entry to the bases with no decreased or absent breath sounds Gastrointestinal: Bowel sounds x 4 quadrants. Soft and nontender to palpation. No guarding or rigidity. No distention. Musculoskeletal: Full range of motion to all extremities. No obvious deformities noted Neurologic:  Normal for age. No gross focal neurologic deficits are appreciated.  Skin:  Skin is warm, dry and intact. No rash noted. Psychiatric: Mood and affect are normal for age. Speech and behavior are normal.   ____________________________________________   LABS (all labs ordered are listed, but only abnormal results are displayed)  Labs Reviewed - No data to display ____________________________________________  EKG   ____________________________________________  RADIOLOGY   No results found.  ____________________________________________    PROCEDURES  Procedure(s) performed:     Procedures     Medications - No data to display   ____________________________________________   INITIAL IMPRESSION / ASSESSMENT AND PLAN / ED COURSE  Pertinent labs & imaging results that were available during my care of the patient were reviewed by me and considered in my medical decision making (see chart for details).    Assessment and plan Wound check 35 year old male presents to the urgent care with concern for halitosis after dental extraction.  No gingival hypertrophy or erythema was visualized.  Dentist did recommend extending antibiotic and recommended that patient seek care urgent care.  We will start patient on clindamycin as he recently completed a course of amoxicillin.  Recommended starting probiotic and following up with local dentist.      ____________________________________________  FINAL CLINICAL IMPRESSION(S) / ED DIAGNOSES  Final diagnoses:  Wound check, abscess      NEW MEDICATIONS STARTED DURING THIS VISIT:  ED Discharge Orders           Ordered    clindamycin (CLEOCIN) 300 MG capsule  4 times daily        01/14/21 1345                This chart was dictated using voice recognition software/Dragon. Despite best efforts to proofread, errors can occur which can change the meaning. Any change was purely unintentional.     Orvil Feil, PA-C 01/14/21 1348

## 2022-02-08 IMAGING — DX DG CHEST 2V
2 series · 3 of 3 positions shown · non-contrast
Comparison: 05/17/2019

CLINICAL DATA: Chest pain

EXAM:
CHEST - 2 VIEW

[chest pa]
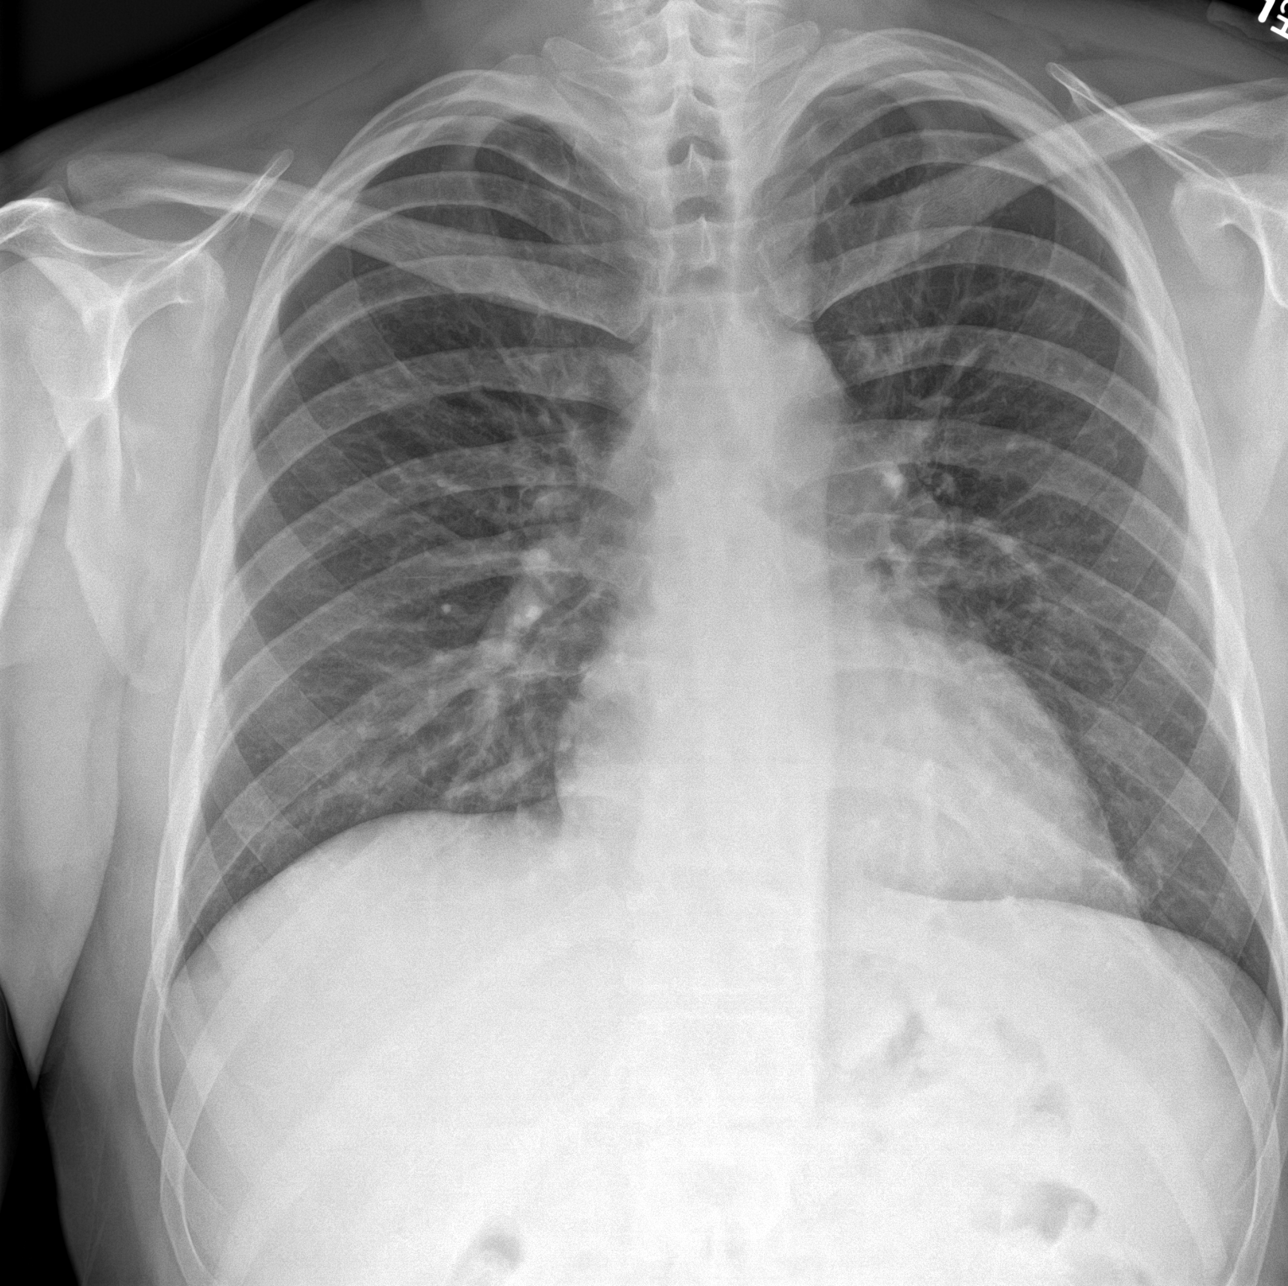

[Series 2: chest lat · 0.14mm/px · 2 of 2 slices shown]
[im 1/2]
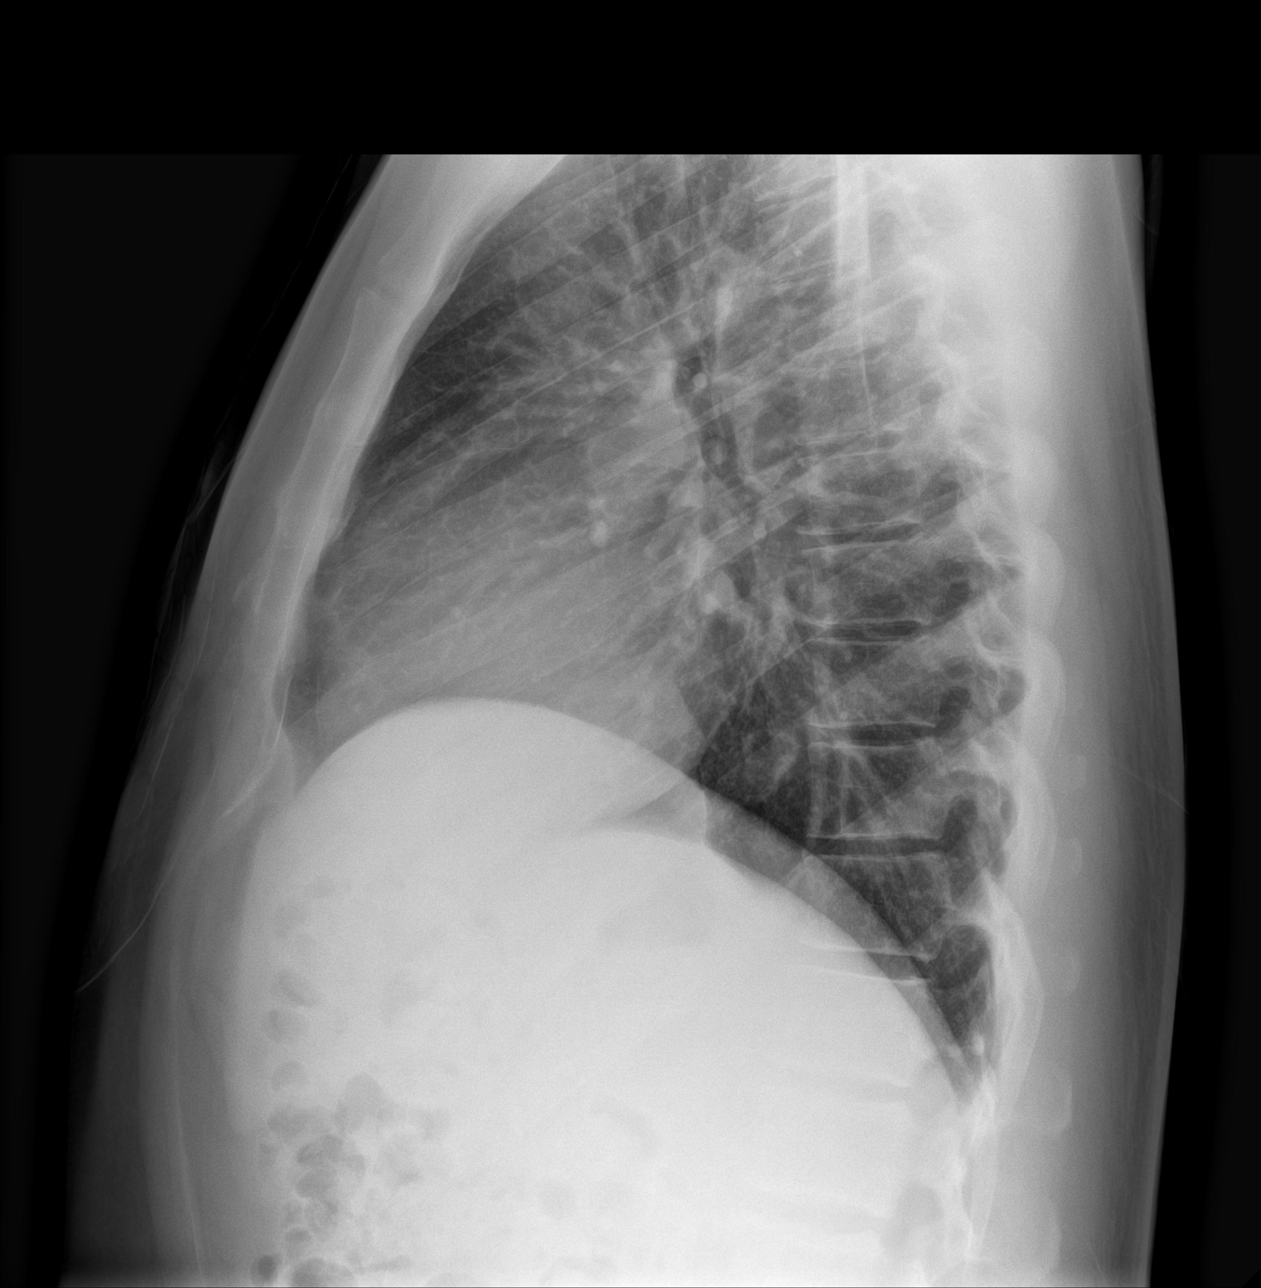
[im 2/2]
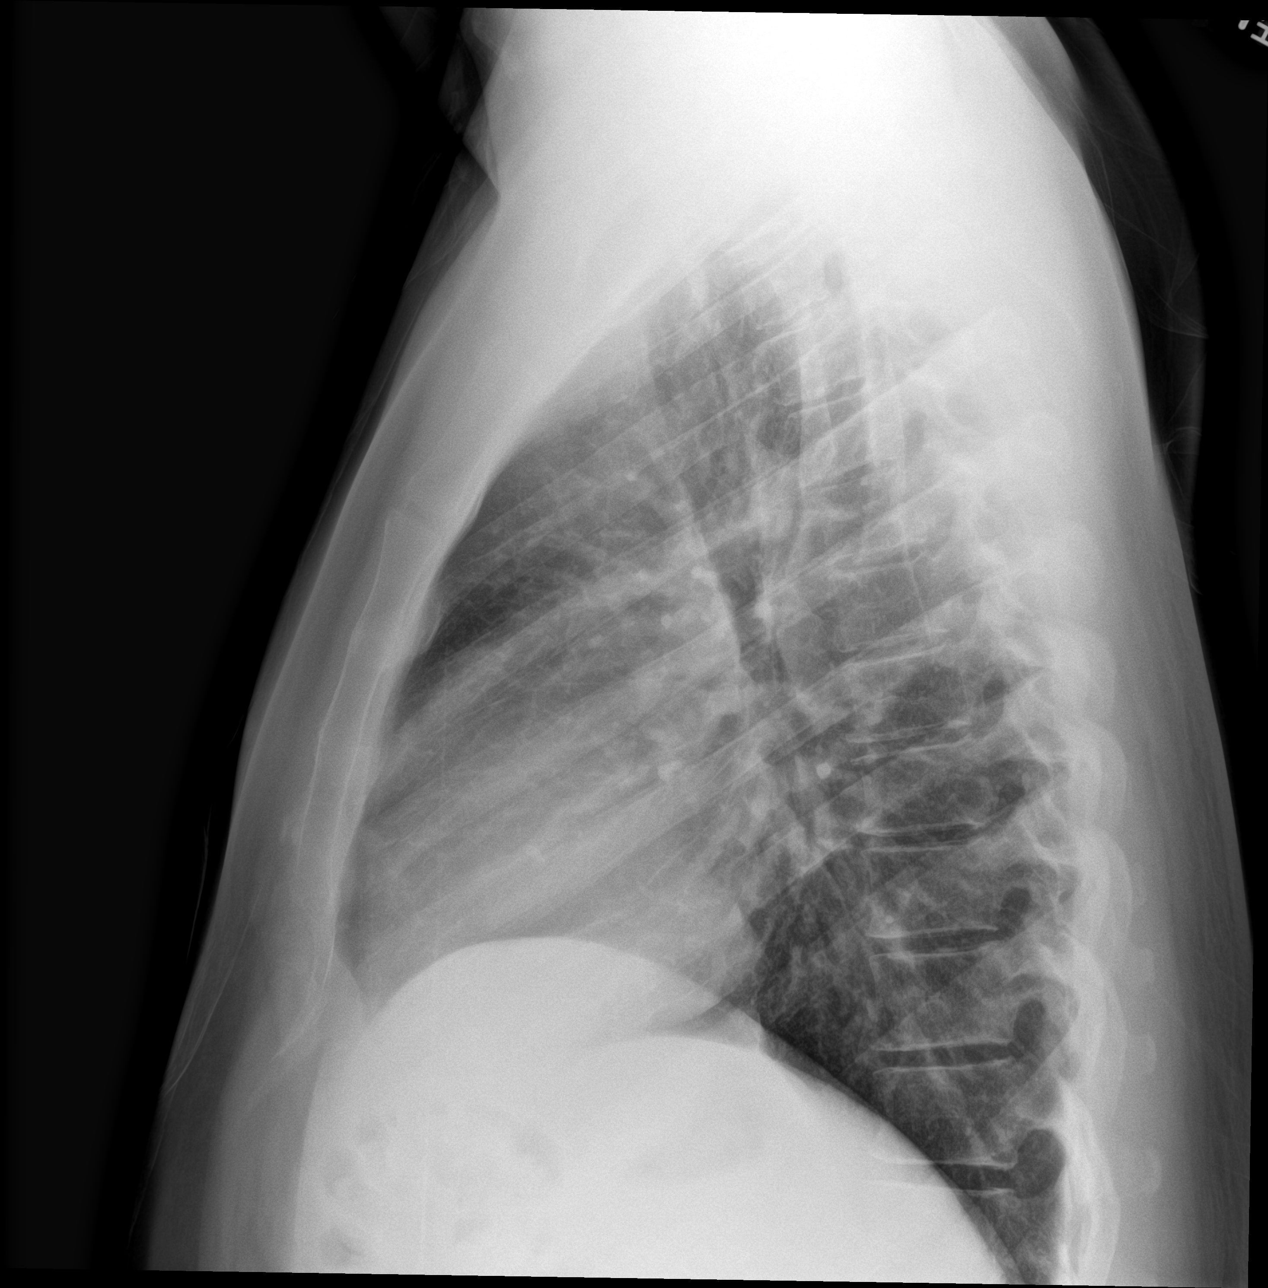

[3 of 3 positions shown; findings below may reference images not displayed]

FINDINGS: The heart size and mediastinal contours are within normal limits.
Both lungs are clear. The visualized skeletal structures are
unremarkable.
IMPRESSION: No active cardiopulmonary disease.

## 2023-05-11 ENCOUNTER — Encounter (HOSPITAL_BASED_OUTPATIENT_CLINIC_OR_DEPARTMENT_OTHER): Payer: Self-pay

## 2023-05-11 ENCOUNTER — Emergency Department (HOSPITAL_BASED_OUTPATIENT_CLINIC_OR_DEPARTMENT_OTHER)
Admission: EM | Admit: 2023-05-11 | Discharge: 2023-05-11 | Disposition: A | Discharge status: Home / Self Care | Attending: Emergency Medicine | Admitting: Emergency Medicine

## 2023-05-11 DIAGNOSIS — S86111A Strain of other muscle(s) and tendon(s) of posterior muscle group at lower leg level, right leg, initial encounter: Secondary | ICD-10-CM | POA: Insufficient documentation

## 2023-05-11 DIAGNOSIS — X501XXA Overexertion from prolonged static or awkward postures, initial encounter: Secondary | ICD-10-CM | POA: Diagnosis not present

## 2023-05-11 DIAGNOSIS — S8991XA Unspecified injury of right lower leg, initial encounter: Secondary | ICD-10-CM | POA: Diagnosis present

## 2023-05-11 DIAGNOSIS — Y9302 Activity, running: Secondary | ICD-10-CM | POA: Insufficient documentation

## 2023-05-11 MED ORDER — OXYCODONE HCL 5 MG PO TABS: 5.0000 mg | ORAL_TABLET | Freq: Four times a day (QID) | ORAL | 0 refills | Status: AC | PRN

## 2023-05-11 MED ORDER — HYDROCODONE-ACETAMINOPHEN 5-325 MG PO TABS
1.0000 | ORAL_TABLET | Freq: Once | ORAL | Status: DC
  Filled 2023-05-11: qty 1

## 2023-05-11 NOTE — ED Provider Notes (Signed)
 [place] EMERGENCY DEPARTMENT AT [facility] Provider Note   CSN: [id number] Arrival date & time: 05/11/23  1158     History  No chief complaint on file.   Albert Taylor is a 37 y.o. male no pertinent past medical history presented with right calf pain that has been present since this morning.  Patient states he was at his daughter's softball practice when he began running and felt a pop in his calf and has not been able to bear weight since then.  Patient states that he can move his foot up but not down.  Patient still wiggle his toes.  Patient denies any swelling.  Patient has not taken any medications.  Patient denies any other trauma to the area.   Home Medications Prior to Admission medications   Medication Sig Start Date End Date Taking? Authorizing Provider  oxyCODONE (ROXICODONE) 5 MG immediate release tablet Take 1 tablet (5 mg total) by mouth every 6 (six) hours as needed for up to 3 days for severe pain (pain score 7-10). 05/11/23 05/14/23 Yes Chandan Fly, Beverly Gust, PA-C  dicyclomine (BENTYL) 20 MG tablet Take 1 tablet (20 mg total) by mouth 3 (three) times daily as needed (abdominal pain). 03/30/18   Phineas Semen, MD  EPINEPHrine 0.3 mg/0.3 mL IJ SOAJ injection Inject 0.3 mg into the muscle as needed for anaphylaxis. 11/15/20   Tegeler, Canary Brim, MD  omeprazole (PRILOSEC) 20 MG capsule Take 1 capsule (20 mg total) by mouth daily. 11/19/20 12/19/20  Roemhildt, Lorin T, PA-C  sucralfate (CARAFATE) 1 g tablet Take 1 tablet (1 g total) by mouth 4 (four) times daily. 03/30/18   Phineas Semen, MD      Allergies    Patient has no known allergies.    Review of Systems   Review of Systems  Physical Exam Updated Vital Signs BP (!) 139/93 (BP Location: Right Arm)   Pulse 71   Temp 98.6 F (37 C) (Oral)   Resp 16   SpO2 100%  Physical Exam Vitals reviewed.  Constitutional:      General: He is not in acute distress. Cardiovascular:     Rate and Rhythm: Normal rate.      Pulses: Normal pulses.  Musculoskeletal:     Comments: Right calf: Extreme point tenderness to the medial gastrocnemius head, Achilles tendon intact, positive Thompson sign, 5 out of 5 dorsiflexion, cannot plantarflex, cannot bear weight, can wiggle toes Pain not out of proportion Soft compartments  Skin:    General: Skin is warm and dry.     Capillary Refill: Capillary refill takes less than 2 seconds.  Neurological:     Mental Status: He is alert.     Comments: Sensation intact distally  Psychiatric:        Mood and Affect: Mood normal.     ED Results / Procedures / Treatments   Labs (all labs ordered are listed, but only abnormal results are displayed) Labs Reviewed - No data to display  EKG None  Radiology No results found.  Procedures Procedures    Medications Ordered in ED Medications  HYDROcodone-acetaminophen (NORCO/VICODIN) 5-325 MG per tablet 1 tablet (1 tablet Oral Not Given 05/11/23 1325)    ED Course/ Medical Decision Making/ A&P                                 Medical Decision Making Risk Prescription drug management.   Albert Taylor 37  y.o. presented today for right calf pain. Working DDx that I considered at this time includes, but not limited to, ruptured gastrocnemius, contusion, strain/sprain, fracture, dislocation, neurovascular compromise, septic joint, ischemic limb, compartment syndrome.  R/o DDx: contusion, strain/sprain, fracture, dislocation, neurovascular compromise, septic joint, ischemic limb, compartment syndrome: These are considered less likely due to history of present illness, physical exam, labs/imaging findings.  Review of prior external notes: 01/14/2021 ED  Unique Tests and My Independent Interpretation: None  Social Determinants of Health: none  Discussion with Independent Historian: Family member  Discussion of Management of Tests: None  Risk: Medium: prescription drug management  Risk Stratification Score:  None  Plan: On exam patient was no acute distress with stable vitals.  On exam patient does have positive Thompson sign on the right side along with extreme point tenderness to the medial calf head.  Patient was neurovascularly intact.  Patient states he felt a pop and has not been to bear weight since then so concerned they may have ruptured his gastrocnemius muscle and will place him in a short leg posterior splint with slight plantarflexion have him follow-up with an orthopedist.  Will provide crutches as well.  I discussed the possibly doing further imaging like an x-ray or ultrasound however patient I both agree that he does not have a blood clot and does not have a fracture and so we will forego this at this time.  Will give him a few days worth of oxycodone and if Tylenol or ibuprofen do not help with the symptoms.  Discussed the risks of this medicine to which patient verbalized understanding of.  PDMP reviewed.  Patient's physical exam and imaging are reassuring.  I spoke to the patient about RICE therapy including Tylenol every 6 hours needed pain, ice 3-4 times daily for 15 minutes at a time, elevation of extremity, using a brace and to follow-up with their primary care provider.  Patient was given return precautions. Patient stable for discharge at this time.  Patient verbalized understanding of plan.  This chart was dictated using voice recognition software.  Despite best efforts to proofread,  errors can occur which can change the documentation meaning.         Final Clinical Impression(s) / ED Diagnoses Final diagnoses:  Rupture of right gastrocnemius tendon, initial encounter    Rx / DC Orders ED Discharge Orders          Ordered    oxyCODONE (ROXICODONE) 5 MG immediate release tablet  Every 6 hours PRN        05/11/23 1323              Remi Deter 05/11/23 1351    Lorre Nick, MD 05/12/23 [phone]

## 2023-05-11 NOTE — Discharge Instructions (Signed)
 Please follow-up with the orthopedic specialist I have attached your for you today.  Today your exam was concerning for possible calf muscle rupture.  You may take Tylenol ibuprofen every 6 hours needed for pain however prescribe you pain medications you may take.  Please do not operate machinery or drive after taking this as it may make you drowsy.  If you begin to have change in sensation, skin color changes, pain not controlled by the oxycodone I have prescribed for you please return to the ER.  Please do not bear weight until you see orthopedics.

## 2023-05-11 NOTE — ED Triage Notes (Signed)
 He reports sudden pain  in right mid calf while running today. He ambulates with a limp, and is in no distress.

## 2023-08-02 ENCOUNTER — Other Ambulatory Visit: Payer: Self-pay

## 2023-08-02 ENCOUNTER — Encounter (HOSPITAL_BASED_OUTPATIENT_CLINIC_OR_DEPARTMENT_OTHER): Payer: Self-pay

## 2023-08-02 ENCOUNTER — Emergency Department (HOSPITAL_BASED_OUTPATIENT_CLINIC_OR_DEPARTMENT_OTHER)
Admission: EM | Admit: 2023-08-02 | Discharge: 2023-08-03 | Disposition: A | Discharge status: Home / Self Care | Attending: Emergency Medicine | Admitting: Emergency Medicine

## 2023-08-02 ENCOUNTER — Emergency Department (HOSPITAL_BASED_OUTPATIENT_CLINIC_OR_DEPARTMENT_OTHER)

## 2023-08-02 DIAGNOSIS — M79675 Pain in left toe(s): Secondary | ICD-10-CM | POA: Diagnosis present

## 2023-08-02 DIAGNOSIS — L03032 Cellulitis of left toe: Secondary | ICD-10-CM | POA: Insufficient documentation

## 2023-08-02 DIAGNOSIS — L0390 Cellulitis, unspecified: Secondary | ICD-10-CM

## 2023-08-02 MED ORDER — CLINDAMYCIN HCL 150 MG PO CAPS
300.0000 mg | ORAL_CAPSULE | Freq: Once | ORAL | Status: AC
  Administered 2023-08-02: 300 mg via ORAL
  Filled 2023-08-02: qty 2

## 2023-08-02 MED ORDER — CLINDAMYCIN HCL 300 MG PO CAPS: 300.0000 mg | ORAL_CAPSULE | Freq: Three times a day (TID) | ORAL | 0 refills | Status: AC

## 2023-08-02 MED ORDER — ACETAMINOPHEN 500 MG PO TABS
1000.0000 mg | ORAL_TABLET | Freq: Once | ORAL | Status: AC
  Administered 2023-08-02: 1000 mg via ORAL
  Filled 2023-08-02: qty 2

## 2023-08-02 NOTE — ED Triage Notes (Signed)
 Pt presents c/o blisters on/between pinky toe and 4th toe on L foot. Redness, blisters, some swelling noted to both toes. Pt also reports 'L foot 'feels like it's falling asleep constantly'. Reports wears workboots often, ongoing x2 days.

## 2023-08-02 NOTE — ED Provider Notes (Signed)
 [place] EMERGENCY DEPARTMENT AT [facility] Provider Note   CSN: [id number] Arrival date & time: 08/02/23  2000     History Chief Complaint  Patient presents with   Toe Pain    HPI Albert Taylor is a 38 y.o. male presenting for Chief complaint of toe pain.  States that he had a ingrown tail nail on his left foot.  He felt it pressing into another toe. he was having discomfort at the site but it has rapidly become erythematous and swollen over the past 3 days.  Endorses pain since distal neuropathy but denies fevers chills nausea vomiting shortness of breath.  He is able to ambulate.  Patient's recorded medical, surgical, social, medication list and allergies were reviewed in the Snapshot window as part of the initial history.   Review of Systems   Review of Systems  Constitutional:  Negative for chills and fever.  HENT:  Negative for ear pain and sore throat.   Eyes:  Negative for pain and visual disturbance.  Respiratory:  Negative for cough and shortness of breath.   Cardiovascular:  Negative for chest pain and palpitations.  Gastrointestinal:  Negative for abdominal pain and vomiting.  Genitourinary:  Negative for dysuria and hematuria.  Musculoskeletal:  Negative for arthralgias and back pain.  Skin:  Positive for rash. Negative for color change.  Neurological:  Negative for seizures and syncope.  All other systems reviewed and are negative.   Physical Exam Updated Vital Signs BP (!) 135/93 (BP Location: Left Arm)   Pulse 70   Temp 98.5 F (36.9 C) (Oral)   Resp 20   Ht 6' 6 (1.981 m)   Wt 136.1 kg   SpO2 98%   BMI 34.67 kg/m  Physical Exam Vitals and nursing note reviewed.  Constitutional:      General: He is not in acute distress.    Appearance: He is well-developed.  HENT:     Head: Normocephalic and atraumatic.   Eyes:     Conjunctiva/sclera: Conjunctivae normal.    Cardiovascular:     Rate and Rhythm: Normal rate and regular rhythm.      Heart sounds: No murmur heard. Pulmonary:     Effort: Pulmonary effort is normal. No respiratory distress.     Breath sounds: Normal breath sounds.  Abdominal:     Palpations: Abdomen is soft.     Tenderness: There is no abdominal tenderness.   Musculoskeletal:        General: Swelling present.     Cervical back: Neck supple.   Skin:    General: Skin is warm and dry.     Capillary Refill: Capillary refill takes less than 2 seconds.     Findings: Erythema present.   Neurological:     Mental Status: He is alert.   Psychiatric:        Mood and Affect: Mood normal.      ED Course/ Medical Decision Making/ A&P    Procedures Procedures   Medications Ordered in ED Medications  acetaminophen  (TYLENOL ) tablet 1,000 mg (1,000 mg Oral Given 08/02/23 2354)  clindamycin  (CLEOCIN ) capsule 300 mg (300 mg Oral Given 08/02/23 2354)    Medical Decision Making:   Albert Taylor is a 38 y.o. male who presented to the ED today with left foot erythema detailed above.    Obvious left foot erythema and swelling complete initial physical exam performed, notably the patient  Hemodynamically stable no acute distress.    Reviewed and confirmed nursing documentation  for past medical history, family history, social history.    Initial Assessment:   With the patient's presentation of left foot swelling, most likely diagnosis is cellulitis. Other diagnoses were considered including (but not limited to) osteomyelitis, abscess. These are considered less likely due to history of present illness and physical exam findings.   This is most consistent with an acute life/limb threatening illness complicated by underlying chronic conditions.  Initial Plan:  X-ray to evaluate for bony involvement given degree of swelling. Soft tissue palpation without evidence of a abscess.  Reassessment and Plan:   Patient's history of present on his physical exam findings are most consistent with cellulitis.  No evidence of  osteomyelitis on x-ray.  Will treat with clindamycin  3 times daily x 5 days plan for reassessment with PCP.  Educated on importance of follow-up with PCP for evaluation for underlying etiology.   Disposition:  I have considered need for hospitalization, however, considering all of the above, I believe this patient is stable for discharge at this time.  Patient/family educated about specific return precautions for given chief complaint and symptoms.  Patient/family educated about follow-up with PCP .     Patient/family expressed understanding of return precautions and need for follow-up. Patient spoken to regarding all imaging and laboratory results and appropriate follow up for these results. All education provided in verbal form with additional information in written form. Time was allowed for answering of patient questions. Patient discharged.    Emergency Department Medication Summary:   Medications  acetaminophen  (TYLENOL ) tablet 1,000 mg (1,000 mg Oral Given 08/02/23 2354)  clindamycin  (CLEOCIN ) capsule 300 mg (300 mg Oral Given 08/02/23 2354)         Clinical Impression:  1. Cellulitis, unspecified cellulitis site      Data Unavailable   Final Clinical Impression(s) / ED Diagnoses Final diagnoses:  Cellulitis, unspecified cellulitis site    Rx / DC Orders ED Discharge Orders          Ordered    clindamycin  (CLEOCIN ) 300 MG capsule  3 times daily        08/02/23 2344              Jerral Meth, MD 08/03/23 [phone]

## 2023-08-03 ENCOUNTER — Emergency Department (HOSPITAL_BASED_OUTPATIENT_CLINIC_OR_DEPARTMENT_OTHER)

## 2023-10-03 NOTE — Progress Notes (Deleted)
 New Patient Visit  Subjective:     Patient ID: Albert Taylor, male    DOB: 1986/01/14, 38 y.o.   MRN: [id number]  No chief complaint on file.   HPI  Discussed the use of AI scribe software for clinical note transcription with the patient, who gave verbal consent to proceed.  History of Present Illness      ROS Per HPI  Outpatient Encounter Medications as of 10/04/2023  Medication Sig   dicyclomine  (BENTYL ) 20 MG tablet Take 1 tablet (20 mg total) by mouth 3 (three) times daily as needed (abdominal pain).   EPINEPHrine  0.3 mg/0.3 mL IJ SOAJ injection Inject 0.3 mg into the muscle as needed for anaphylaxis.   omeprazole  (PRILOSEC) 20 MG capsule Take 1 capsule (20 mg total) by mouth daily.   sucralfate  (CARAFATE ) 1 g tablet Take 1 tablet (1 g total) by mouth 4 (four) times daily.   No facility-administered encounter medications on file as of 10/04/2023.    No past medical history on file.  Past Surgical History:  Procedure Laterality Date   APPENDECTOMY      Family History  Problem Relation Age of Onset   Healthy Mother    Heart attack Father     Social History   Socioeconomic History   Marital status: Single    Spouse name: Not on file   Number of children: Not on file   Years of education: Not on file   Highest education level: Not on file  Occupational History   Not on file  Tobacco Use   Smoking status: Every Day    Current packs/day: 0.50    Types: Cigarettes   Smokeless tobacco: Never  Vaping Use   Vaping status: Never Used  Substance and Sexual Activity   Alcohol use: Yes    Comment: socially   Drug use: Never   Sexual activity: Not on file  Other Topics Concern   Not on file  Social History Narrative   Not on file   Social Drivers of Health   Financial Resource Strain: Not on file  Food Insecurity: No Food Insecurity (11/17/2020)   Received from [facility]   Hunger Vital Sign    Within the past 12 months, you worried that your food  would run out before you got the money to buy more.: Never true    Within the past 12 months, the food you bought just didn't last and you didn't have money to get more.: Never true  Transportation Needs: Not on file  Physical Activity: Not on file  Stress: Not on file  Social Connections: Unknown (06/27/2021)   Received from [facility]   Social Network    Social Network: Not on file  Intimate Partner Violence: Unknown (05/19/2021)   Received from [facility]   HITS    Physically Hurt: Not on file    Insult or Talk Down To: Not on file    Threaten Physical Harm: Not on file    Scream or Curse: Not on file       Objective:    There were no vitals taken for this visit.   Physical Exam Vitals and nursing note reviewed.  Constitutional:      General: He is not in acute distress.    Appearance: Normal appearance.  HENT:     Head: Normocephalic and atraumatic.     Right Ear: External ear normal.     Left Ear: External ear normal.     Nose:  Nose normal.     Mouth/Throat:     Mouth: Mucous membranes are moist.     Pharynx: Oropharynx is clear.  Eyes:     Extraocular Movements: Extraocular movements intact.  Cardiovascular:     Rate and Rhythm: Normal rate and regular rhythm.     Pulses: Normal pulses.     Heart sounds: Normal heart sounds.  Pulmonary:     Effort: Pulmonary effort is normal. No respiratory distress.     Breath sounds: Normal breath sounds. No wheezing, rhonchi or rales.  Musculoskeletal:        General: Normal range of motion.     Cervical back: Normal range of motion.     Right lower leg: No edema.     Left lower leg: No edema.  Lymphadenopathy:     Cervical: No cervical adenopathy.  Skin:    General: Skin is warm and dry.  Neurological:     General: No focal deficit present.     Mental Status: He is alert and oriented to person, place, and time.  Psychiatric:        Mood and Affect: Mood normal.        Behavior: Behavior normal.     No  results found for any visits on 10/04/23.      Assessment & Plan:   Assessment and Plan Assessment & Plan      No orders of the defined types were placed in this encounter.    No orders of the defined types were placed in this encounter.   No follow-ups on file.  Corean LITTIE Ku, FNP

## 2023-10-03 NOTE — Patient Instructions (Incomplete)

## 2023-10-04 ENCOUNTER — Ambulatory Visit: Admitting: Family Medicine
